# Patient Record
Sex: Female | Born: 1995 | Race: Black or African American | Hispanic: No | Marital: Single | Smoking: Current every day smoker
Health system: Southern US, Community
[De-identification: ages and names within clinical notes are randomized; demographics above are authoritative.]

## PROBLEM LIST (undated history)

## (undated) DIAGNOSIS — J45909 Unspecified asthma, uncomplicated: Secondary | ICD-10-CM

## (undated) DIAGNOSIS — F329 Major depressive disorder, single episode, unspecified: Secondary | ICD-10-CM

## (undated) DIAGNOSIS — G43909 Migraine, unspecified, not intractable, without status migrainosus: Secondary | ICD-10-CM

---
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
## 2018-04-01 IMAGING — CT CT ABD-PELV W/ CM
2 of 4 series · 16 of 46 positions shown, 18 images · IV contrast (APPLIED)
Comparison: Right upper quadrant ultrasound performed earlier today
at [DATE] p.m., and CT of the abdomen and pelvis from 07/10/2014

CLINICAL DATA: Acute onset of generalized abdominal pain and
vaginal discharge. Vomiting. Initial encounter.

EXAM:
CT ABDOMEN AND PELVIS WITH CONTRAST
TECHNIQUE: Multidetector CT imaging of the abdomen and pelvis was performed
using the standard protocol following bolus administration of
intravenous contrast.
CONTRAST:  100mL K5NLVD-V99 IOPAMIDOL (K5NLVD-V99) INJECTION 61%

[Series 2: axial st · axial · 0.80mm/px · z∈[-657,-267]mm · 13 of 86 slices shown, 15 images]
[im 4/86  soft-tissue]
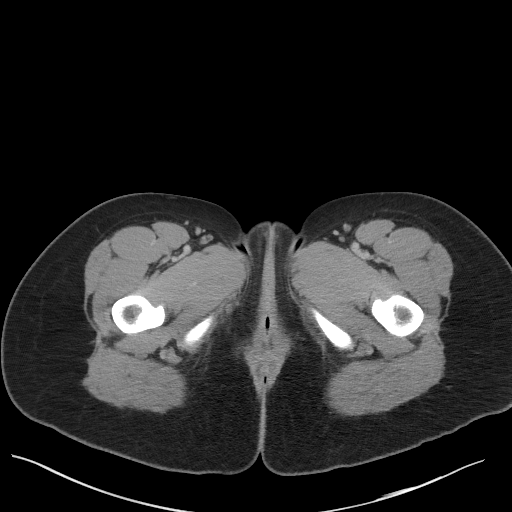
[im 4/86  bone]
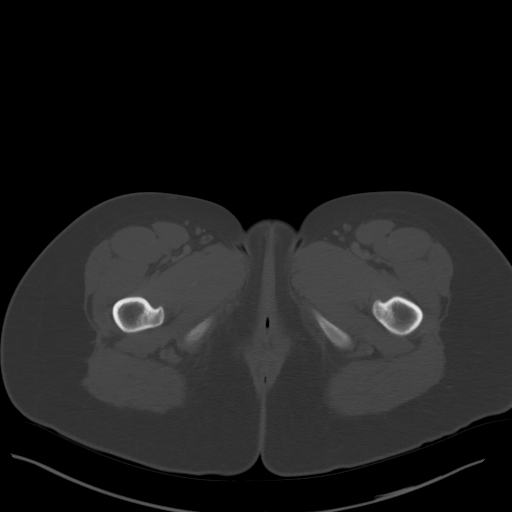
[im 11/86  soft-tissue]
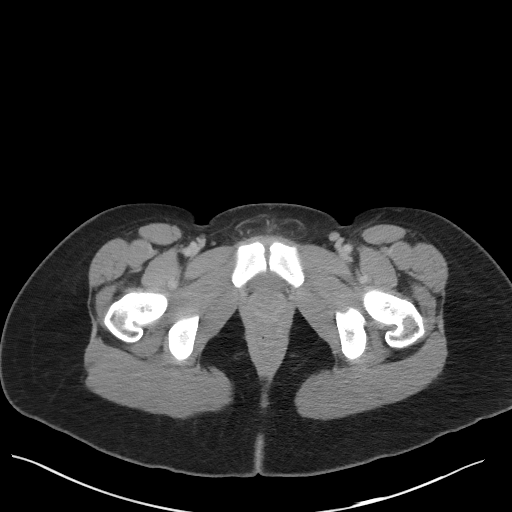
[im 18/86  soft-tissue]
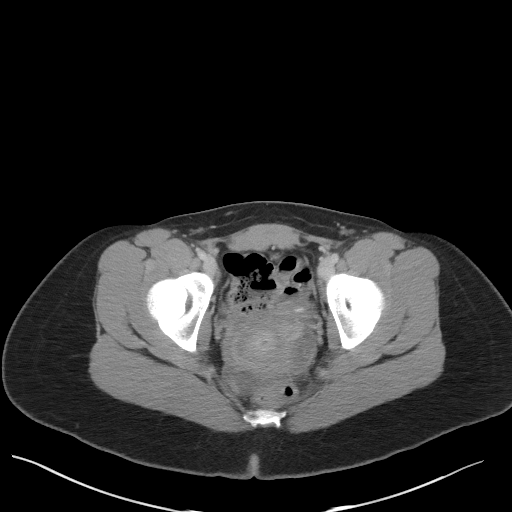
[im 25/86  soft-tissue]
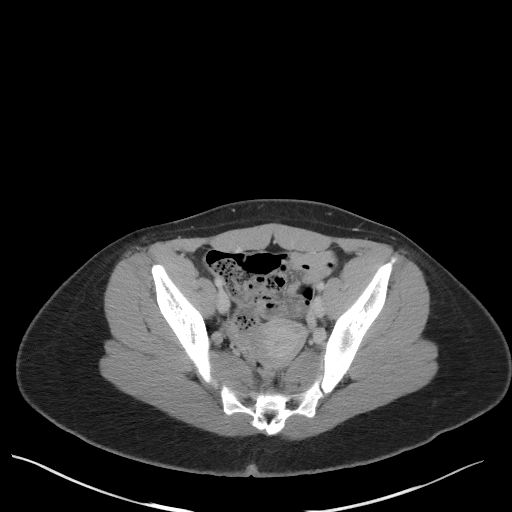
[im 29/86  soft-tissue]
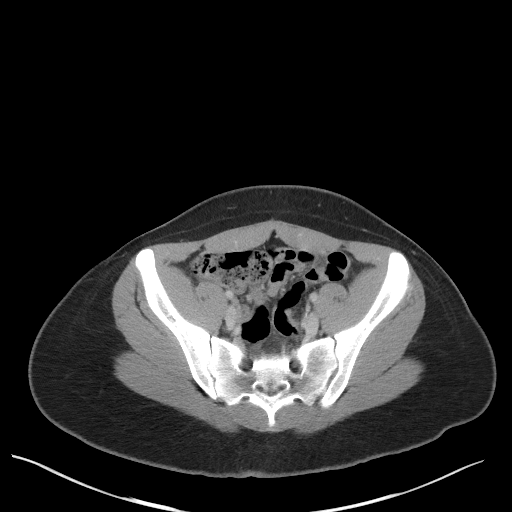
[im 36/86  soft-tissue]
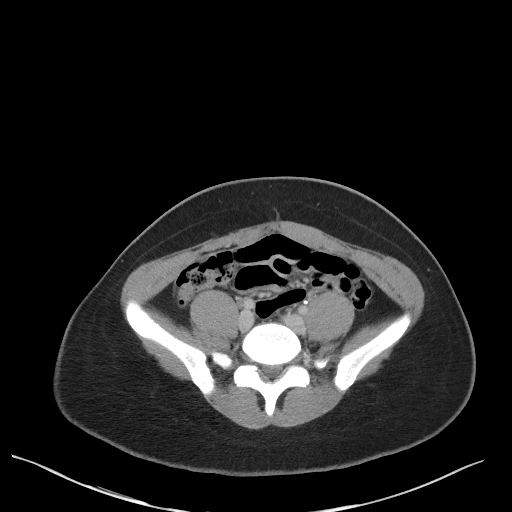
[im 43/86  soft-tissue]
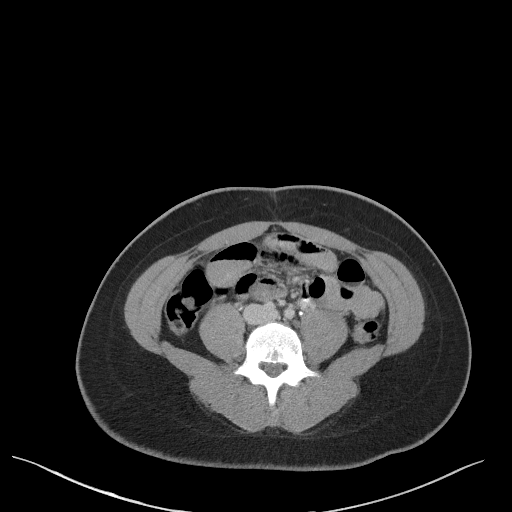
[im 50/86  soft-tissue]
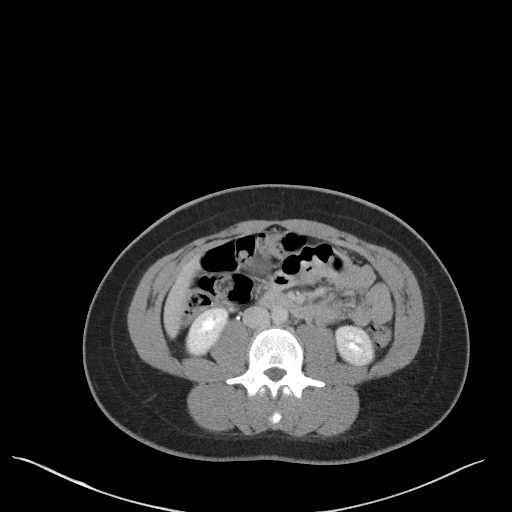
[im 57/86  soft-tissue]
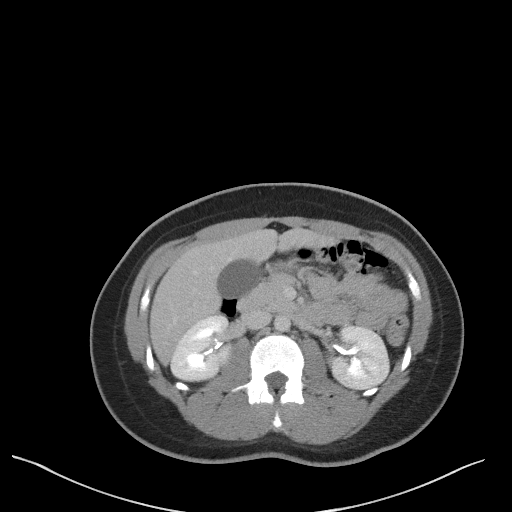
[im 57/86  bone]
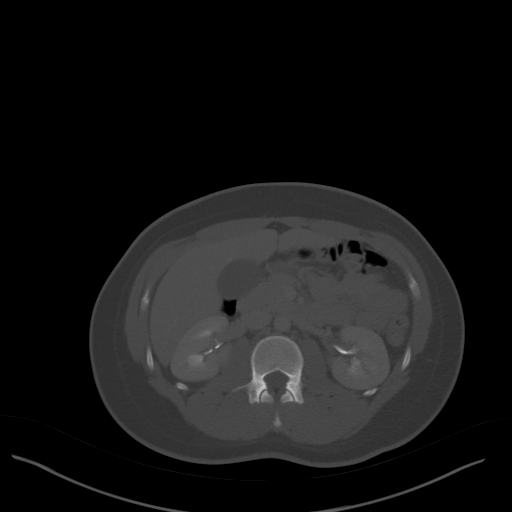
[im 61/86  soft-tissue]
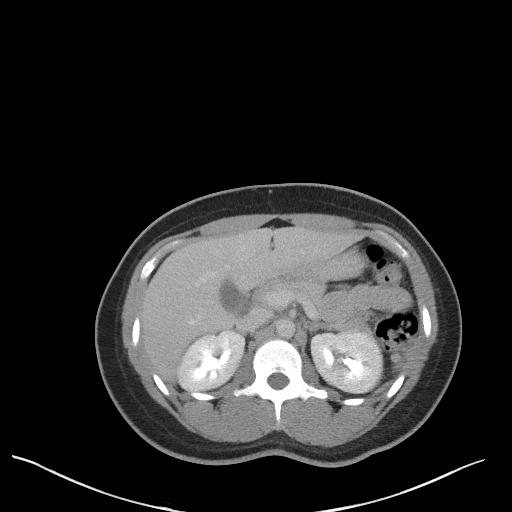
[im 68/86  soft-tissue]
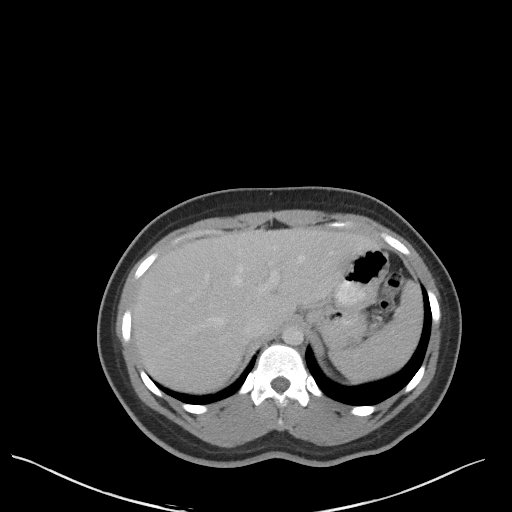
[im 75/86  soft-tissue]
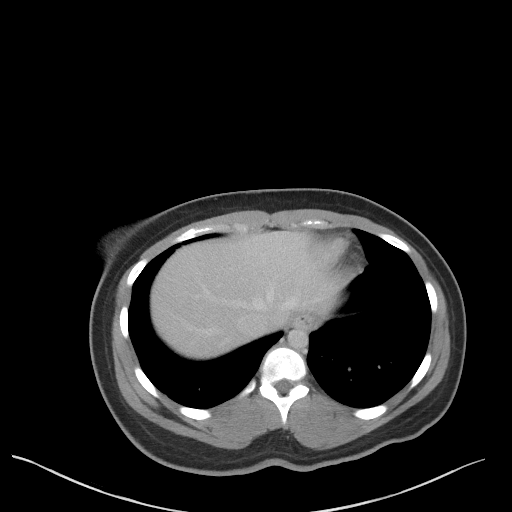
[im 82/86  soft-tissue]
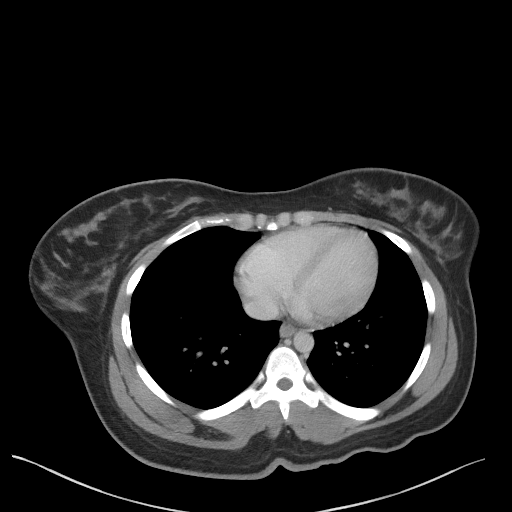

[Series 5: coronal st · coronal · 0.88mm/px · 3 of 69 slices shown]
[im 23/69  soft-tissue]
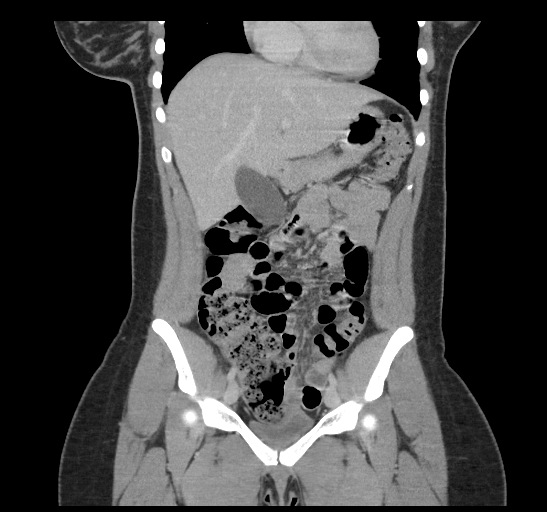
[im 31/69  soft-tissue]
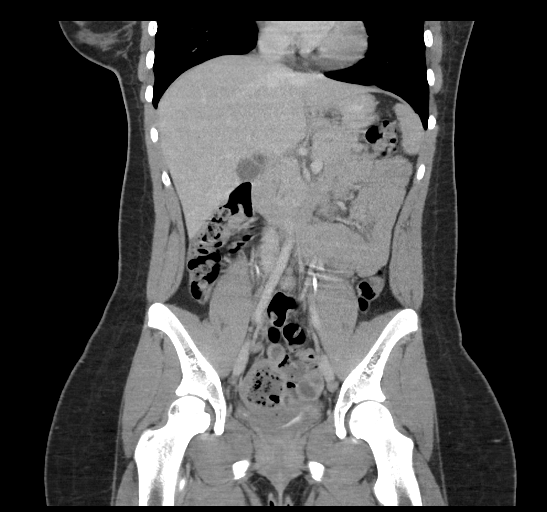
[im 38/69  soft-tissue]
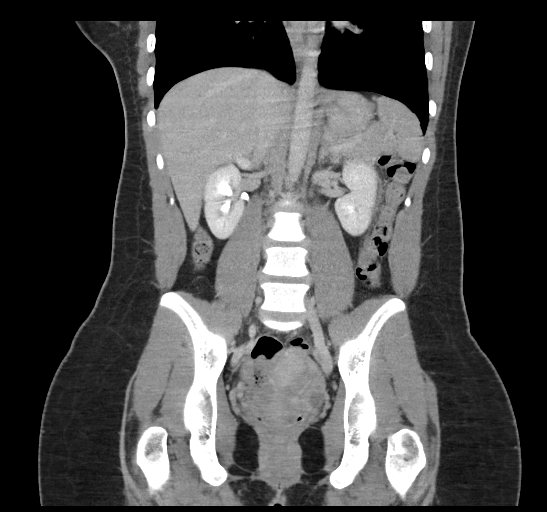

[16 of 46 positions shown; findings below may reference images not displayed]

FINDINGS: Lower chest: The visualized lung bases are grossly clear. The
visualized portions of the mediastinum are unremarkable.

Hepatobiliary: The liver is unremarkable in appearance. The
gallbladder is unremarkable in appearance. The common bile duct
remains normal in caliber.

Pancreas: The pancreas is within normal limits.

Spleen: The spleen is unremarkable in appearance.

Adrenals/Urinary Tract: The adrenal glands are unremarkable in
appearance. The kidneys are within normal limits. There is no
evidence of hydronephrosis. No renal or ureteral stones are
identified, though evaluation for stones is limited given contrast
in the renal calyces. No perinephric stranding is seen.

Stomach/Bowel: The appendix is not definitely characterized ; there
is no evidence for appendicitis. The colon is largely decompressed
and grossly unremarkable in appearance.

The small bowel is unremarkable in appearance. The stomach is
decompressed and grossly unremarkable.

Vascular/Lymphatic: The abdominal aorta is unremarkable in
appearance. The inferior vena cava is grossly unremarkable. No
retroperitoneal lymphadenopathy is seen. No pelvic sidewall
lymphadenopathy is identified.

Reproductive: There is mild soft tissue inflammation about the right
ovary, of uncertain significance. The uterus is grossly
unremarkable. The left ovary is grossly unremarkable in appearance.
The bladder is decompressed and not well assessed.

Other: No additional soft tissue abnormalities are seen.

Musculoskeletal: No acute osseous abnormalities are identified. The
visualized musculature is unremarkable in appearance.
IMPRESSION: Mild nonspecific soft tissue inflammation about the right ovary.
Would correlate clinically for any evidence of pelvic inflammatory
disease.

## 2018-11-19 IMAGING — US US ABDOMEN LIMITED
1 series · 14 of 25 positions shown · non-contrast
Comparison: None.

CLINICAL DATA: Right upper quadrant pain for 5 days

EXAM:
ULTRASOUND ABDOMEN LIMITED RIGHT UPPER QUADRANT

[Series 1: us abdomen limited · 0.21mm/px · 14 of 55 slices shown]
[im 1/55]
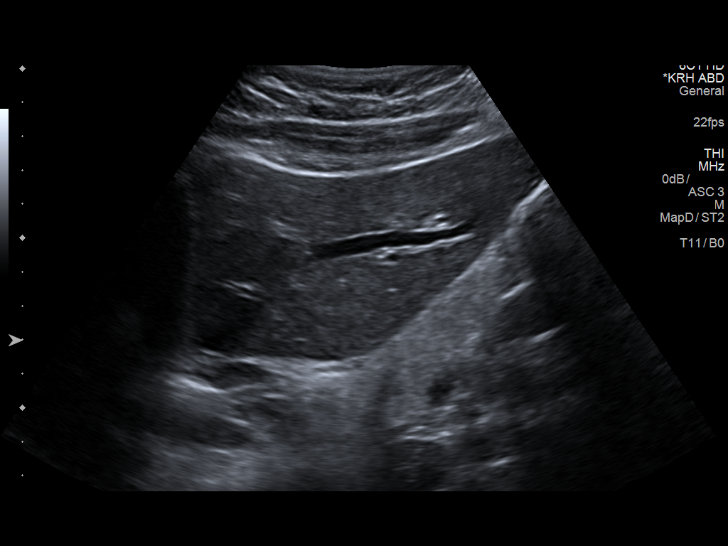
[im 5/55]
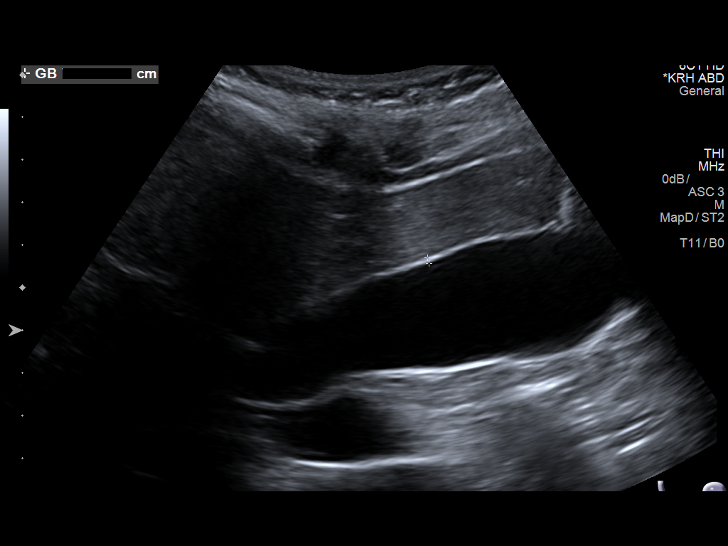
[im 10/55]
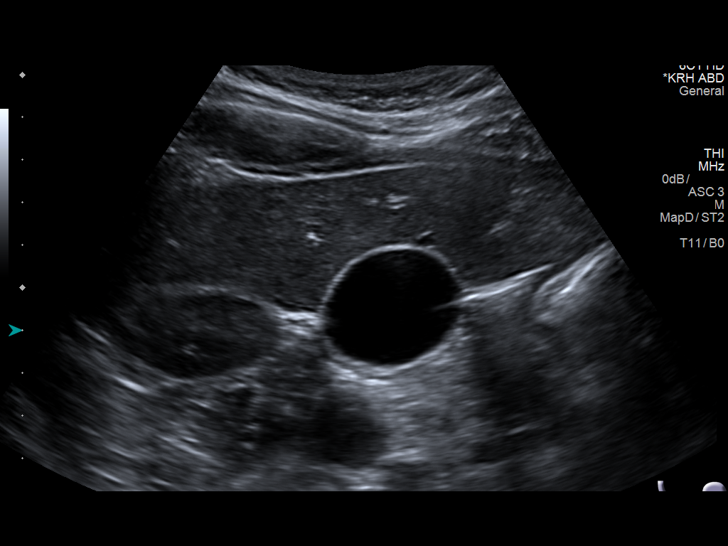
[im 14/55]
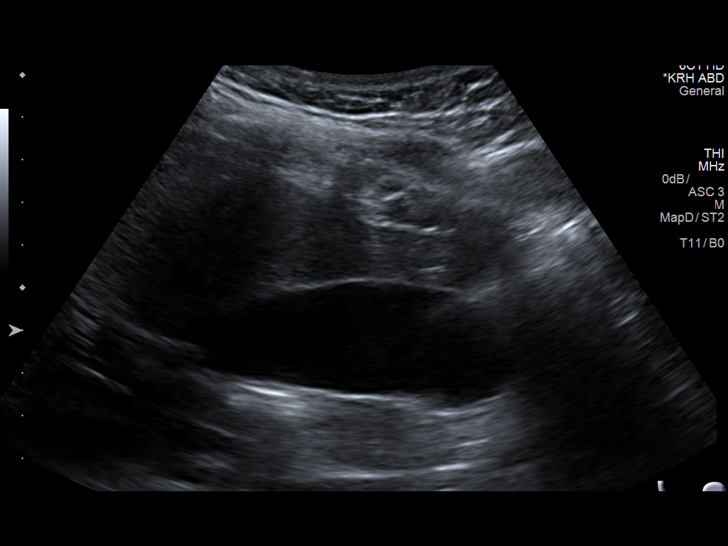
[im 19/55]
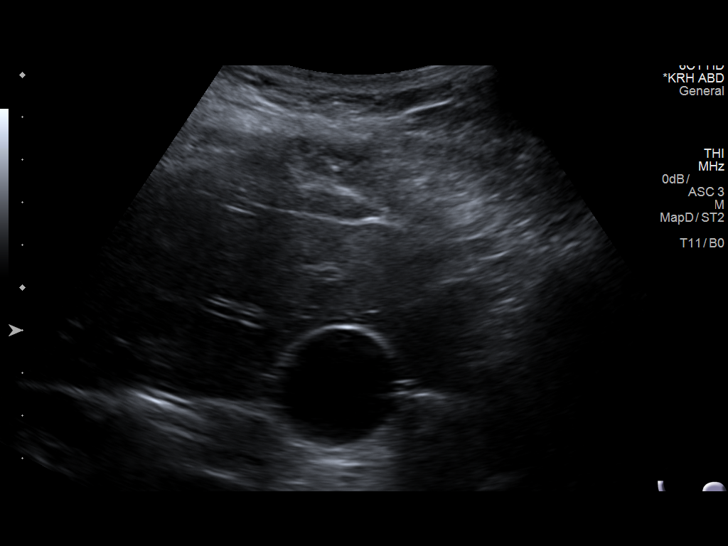
[im 21/55]
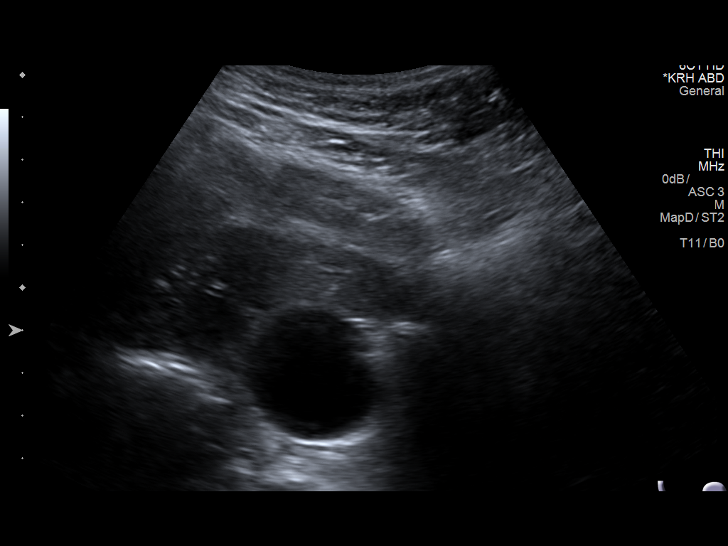
[im 25/55]
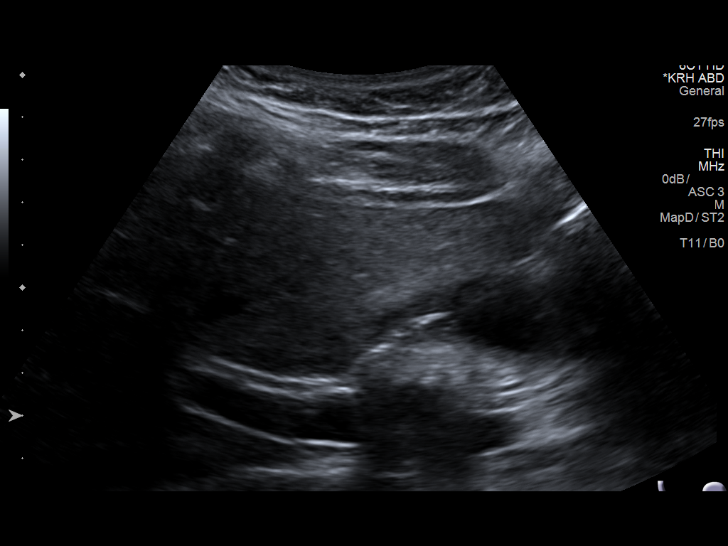
[im 30/55]
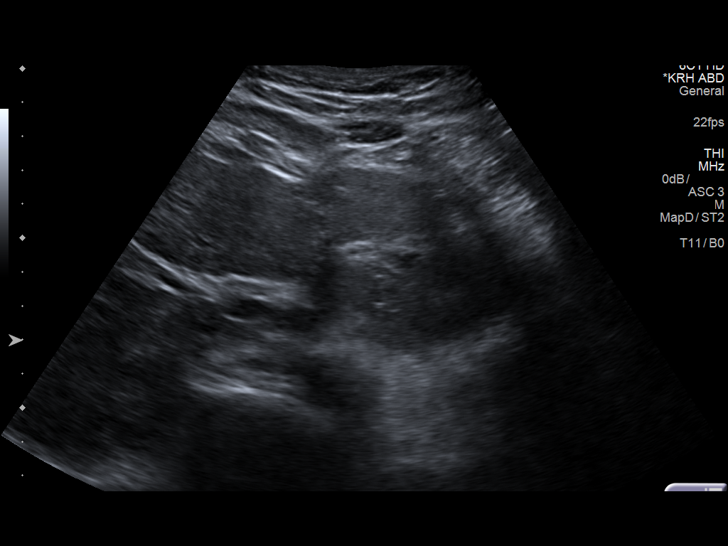
[im 34/55]
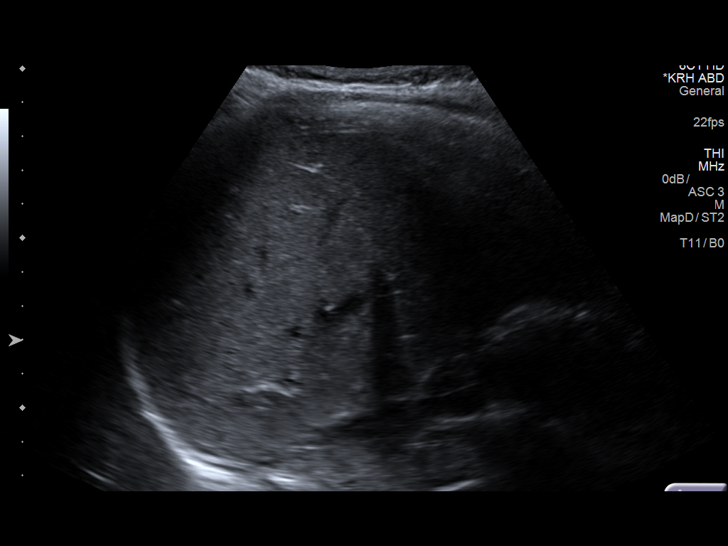
[im 37/55]
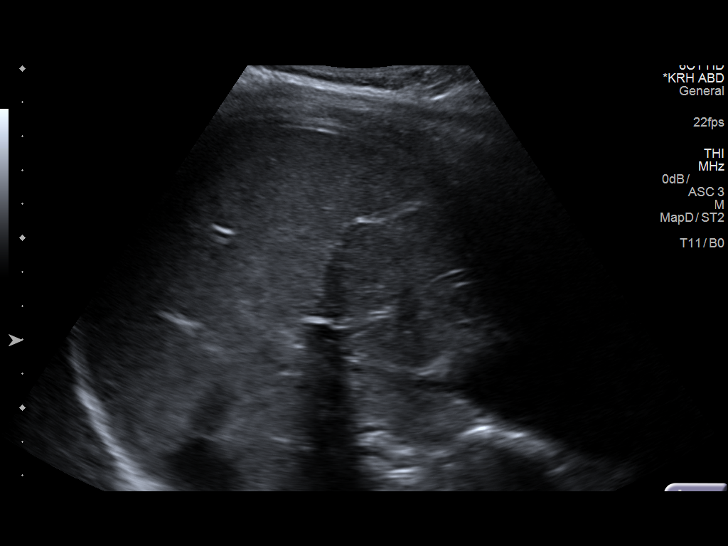
[im 41/55]
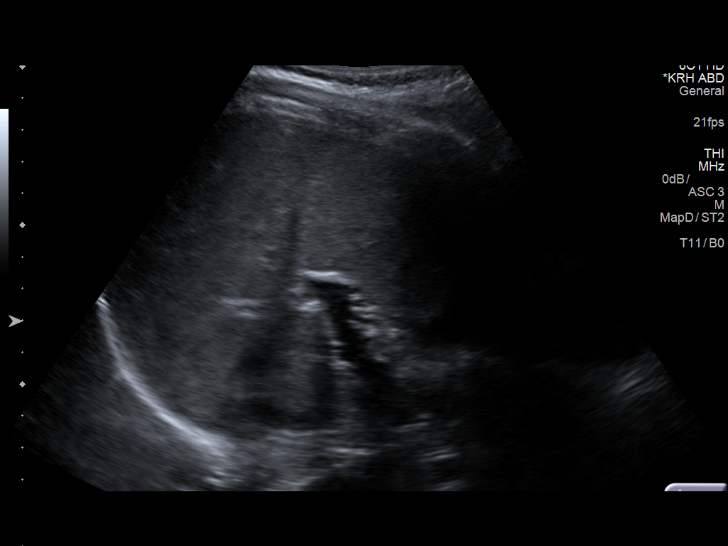
[im 46/55]
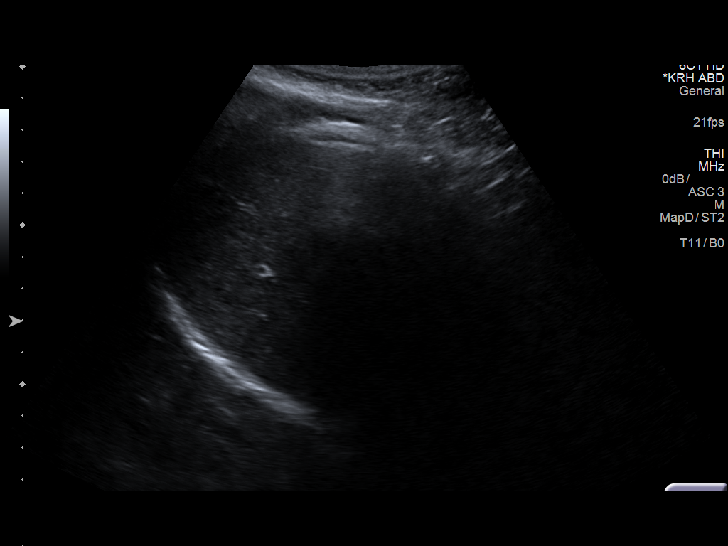
[im 50/55]
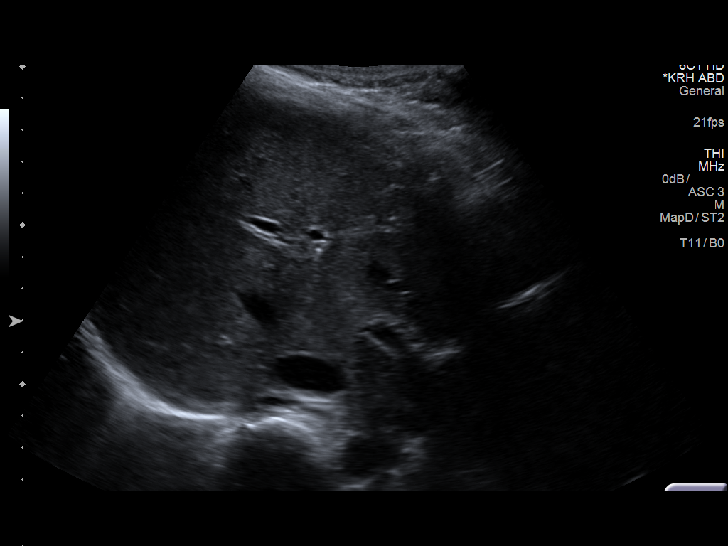
[im 55/55]
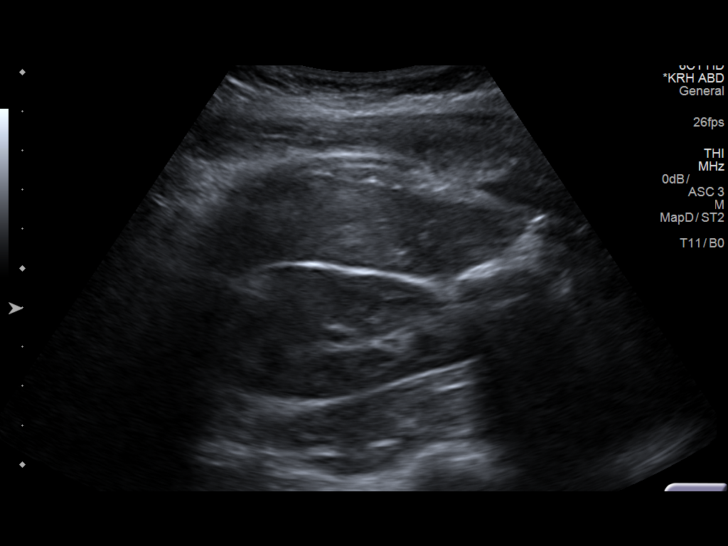

[14 of 25 positions shown; findings below may reference images not displayed]

FINDINGS: Gallbladder:

No gallstones or wall thickening visualized. No sonographic Murphy
sign noted by sonographer.

Common bile duct:

Diameter: 1.5 mm

Liver:

No focal lesion identified. Within normal limits in parenchymal
echogenicity.
IMPRESSION: Normal right upper quadrant ultrasound.

## 2022-02-13 ENCOUNTER — Emergency Department (HOSPITAL_BASED_OUTPATIENT_CLINIC_OR_DEPARTMENT_OTHER)
Admission: EM | Admit: 2022-02-13 | Discharge: 2022-02-14 | Discharge status: Against Medical Advice | Payer: Worker's Compensation | Attending: Emergency Medicine | Admitting: Emergency Medicine

## 2022-02-13 ENCOUNTER — Other Ambulatory Visit: Payer: Self-pay

## 2022-02-13 ENCOUNTER — Encounter (HOSPITAL_BASED_OUTPATIENT_CLINIC_OR_DEPARTMENT_OTHER): Payer: Self-pay | Admitting: Emergency Medicine

## 2022-02-13 DIAGNOSIS — W540XXA Bitten by dog, initial encounter: Secondary | ICD-10-CM | POA: Insufficient documentation

## 2022-02-13 DIAGNOSIS — S71152A Open bite, left thigh, initial encounter: Secondary | ICD-10-CM | POA: Insufficient documentation

## 2022-02-13 DIAGNOSIS — Z23 Encounter for immunization: Secondary | ICD-10-CM | POA: Insufficient documentation

## 2022-02-13 DIAGNOSIS — Z5321 Procedure and treatment not carried out due to patient leaving prior to being seen by health care provider: Secondary | ICD-10-CM | POA: Diagnosis not present

## 2022-02-13 DIAGNOSIS — J45909 Unspecified asthma, uncomplicated: Secondary | ICD-10-CM | POA: Diagnosis not present

## 2022-02-13 DIAGNOSIS — Z7951 Long term (current) use of inhaled steroids: Secondary | ICD-10-CM | POA: Insufficient documentation

## 2022-02-13 DIAGNOSIS — Z91040 Latex allergy status: Secondary | ICD-10-CM | POA: Diagnosis not present

## 2022-02-13 NOTE — ED Triage Notes (Signed)
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  Unknown shot hx of dog.  Tetanus out of date.

## 2022-02-14 ENCOUNTER — Encounter (HOSPITAL_BASED_OUTPATIENT_CLINIC_OR_DEPARTMENT_OTHER): Payer: Self-pay | Admitting: Emergency Medicine

## 2022-02-14 ENCOUNTER — Emergency Department (HOSPITAL_BASED_OUTPATIENT_CLINIC_OR_DEPARTMENT_OTHER)
Admission: EM | Admit: 2022-02-14 | Discharge: 2022-02-14 | Disposition: A | Discharge status: Home / Self Care | Payer: Worker's Compensation | Source: Home / Self Care | Attending: Emergency Medicine | Admitting: Emergency Medicine

## 2022-02-14 DIAGNOSIS — Z91040 Latex allergy status: Secondary | ICD-10-CM | POA: Insufficient documentation

## 2022-02-14 DIAGNOSIS — J45909 Unspecified asthma, uncomplicated: Secondary | ICD-10-CM | POA: Insufficient documentation

## 2022-02-14 DIAGNOSIS — Z7951 Long term (current) use of inhaled steroids: Secondary | ICD-10-CM | POA: Insufficient documentation

## 2022-02-14 DIAGNOSIS — S71152A Open bite, left thigh, initial encounter: Secondary | ICD-10-CM | POA: Insufficient documentation

## 2022-02-14 DIAGNOSIS — Z23 Encounter for immunization: Secondary | ICD-10-CM | POA: Insufficient documentation

## 2022-02-14 DIAGNOSIS — W540XXA Bitten by dog, initial encounter: Secondary | ICD-10-CM | POA: Insufficient documentation

## 2022-02-14 MED ORDER — AMOXICILLIN-POT CLAVULANATE 875-125 MG PO TABS: 1.0000 | ORAL_TABLET | Freq: Two times a day (BID) | ORAL | 0 refills | Status: DC

## 2022-02-14 MED ORDER — TETANUS-DIPHTH-ACELL PERTUSSIS 5-2.5-18.5 LF-MCG/0.5 IM SUSY
0.5000 mL | PREFILLED_SYRINGE | Freq: Once | INTRAMUSCULAR | Status: AC
  Administered 2022-02-14: 0.5 mL via INTRAMUSCULAR
  Filled 2022-02-14: qty 0.5

## 2022-02-14 NOTE — ED Notes (Signed)
Pt does not appear to be in department; not in lobby, not outside.

## 2022-02-14 NOTE — ED Notes (Signed)
Pt discharged to home. Discharge instructions have been discussed with patient and/or family members. Pt verbally acknowledges understanding d/c instructions, and endorses comprehension to checkout at registration before leaving.  °

## 2022-02-14 NOTE — ED Triage Notes (Signed)
Pt arrives pov, steady gait, reports dog bite to LT lateral thigh yesterday evening. Bleeding controlled, unknown if animal UTD on vaccines. Unk. Last tetanus. LWBS after triage yesterday

## 2022-02-14 NOTE — Discharge Instructions (Signed)
Wash the area with soap and water.  You can also put some Vaseline over the area until it is healed.  If it does not look any worse than what it looks right now you do not need to start antibiotics.

## 2022-02-14 NOTE — ED Provider Notes (Signed)
[facility] EMERGENCY DEPARTMENT Provider Note   CSN: [id number] Arrival date & time: 02/14/22  0813     History  Chief Complaint  Patient presents with   Animal Bite    Stephanie Chapman is a 26 y.o. female.  Patient is a 26 year old female with a history of asthma, migraines and depression presenting today after a dog bite.  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  She came to the emergency room last night but the wait was very long and she went home.  She did fill out all the animal control paperwork yesterday.  The dog is a home owned pet and can be followed.  The history is provided by the patient.  Animal Bite      Home Medications Prior to Admission medications   Medication Sig Start Date End Date Taking? Authorizing Provider  amoxicillin-clavulanate (AUGMENTIN) 875-125 MG tablet Take 1 tablet by mouth every 12 (twelve) hours. 02/14/22  Yes Dub Maclellan, Loree Fee, MD  albuterol (PROVENTIL HFA;VENTOLIN HFA) 108 (90 BASE) MCG/ACT inhaler Inhale into the lungs. 09/27/14 09/27/15  [provider]  ALBUTEROL IN Inhale into the lungs.    [provider]  baclofen (LIORESAL) 10 MG tablet TAKE 1 TABLET (10 MG TOTAL) BY MOUTH THREE (3) TIMES A DAY AS NEEDED FOR MUSCLE SPASMS. 08/08/14   [provider]  metroNIDAZOLE (FLAGYL) 500 MG tablet Take 1 tablet (500 mg total) by mouth 2 (two) times daily. 10/10/16   Shary Decamp, PA-C  naproxen (NAPROSYN) 500 MG tablet Take 1 tablet (500 mg total) by mouth 2 (two) times daily. 11/07/92   Delora Fuel, MD  oxyCODONE-acetaminophen (PERCOCET/ROXICET) 5-325 MG tablet Take 2 tablets by mouth every 4 (four) hours as needed for severe pain. 10/10/16   Shary Decamp, PA-C  promethazine (PHENERGAN) 25 MG tablet Take 25 mg by mouth daily as needed. Take 1 tab by mouth every 6 hours prn  09/02/14 09/09/14  [provider]  QUDEXY XR 100 MG CS24 Take 1 tablet at nighttime by mouth 11/16/14   Hickling, Princess Bruins, MD  VENTOLIN HFA 108 (90 BASE) MCG/ACT inhaler INHALE 2 PUFFS EVERY SIX (6) HOURS AS NEEDED FOR WHEEZING OR SHORTNESS OF BREATH. 09/27/14   [provider]      Allergies    Penicillins, Latex, Mango flavor, and Pineapple    Review of Systems   Review of Systems  Physical Exam Updated Vital Signs BP 114/74   Pulse 77   Temp 98.6 F (37 C) (Oral)   Resp 18   Ht 5\' 2"  (1.575 m)   Wt 83.9 kg   LMP 01/19/2022   SpO2 99%   BMI 33.84 kg/m  Physical Exam Vitals and nursing note reviewed.  Cardiovascular:     Rate and Rhythm: Normal rate.     Pulses: Normal pulses.  Pulmonary:     Effort: Pulmonary effort is normal.  Musculoskeletal:       Legs:  Neurological:     Mental Status: She is alert.     ED Results / Procedures / Treatments   Labs (all labs ordered are listed, but only abnormal results are displayed) Labs Reviewed - No data to display  EKG None  Radiology No results found.  Procedures Procedures    Medications Ordered in ED Medications  Tdap (  BOOSTRIX) injection 0.5 mL (has no administration in time range)    ED Course/ Medical Decision Making/ A&P                           Medical Decision Making Risk Prescription drug management.   Patient presenting today after a dog bite last night.  The dog is a pet and the owner is known with animal control paperwork already filled out.  Do not feel that patient needs rabies vaccines today.  Her tetanus shot was updated.  The bite looks fairly superficial without any deep puncture wounds.  Patient given a prescription for antibiotic however reported that she only needed to take it if she started having significant redness or drainage.  Otherwise she will do localized wound care and she is stable for discharge.        Final Clinical Impression(s) / ED  Diagnoses Final diagnoses:  Dog bite, initial encounter    Rx / DC Orders ED Discharge Orders          Ordered    amoxicillin-clavulanate (AUGMENTIN) 875-125 MG tablet  Every 12 hours        02/14/22 0902              Gwyneth Sprout, MD 02/14/22 [phone]

## 2022-02-16 ENCOUNTER — Other Ambulatory Visit: Payer: Self-pay

## 2022-02-16 ENCOUNTER — Encounter (HOSPITAL_BASED_OUTPATIENT_CLINIC_OR_DEPARTMENT_OTHER): Payer: Self-pay | Admitting: Emergency Medicine

## 2022-02-16 ENCOUNTER — Emergency Department (HOSPITAL_BASED_OUTPATIENT_CLINIC_OR_DEPARTMENT_OTHER)
Admission: EM | Admit: 2022-02-16 | Discharge: 2022-02-16 | Disposition: A | Discharge status: Home / Self Care | Payer: 59 | Attending: Emergency Medicine | Admitting: Emergency Medicine

## 2022-02-16 DIAGNOSIS — R52 Pain, unspecified: Secondary | ICD-10-CM | POA: Insufficient documentation

## 2022-02-16 DIAGNOSIS — Z4800 Encounter for change or removal of nonsurgical wound dressing: Secondary | ICD-10-CM | POA: Diagnosis not present

## 2022-02-16 DIAGNOSIS — Z5189 Encounter for other specified aftercare: Secondary | ICD-10-CM

## 2022-02-16 MED ORDER — CYCLOBENZAPRINE HCL 10 MG PO TABS: 10.0000 mg | ORAL_TABLET | Freq: Two times a day (BID) | ORAL | 0 refills | Status: AC | PRN

## 2022-02-16 MED ORDER — CLINDAMYCIN HCL 150 MG PO CAPS: 300.0000 mg | ORAL_CAPSULE | Freq: Three times a day (TID) | ORAL | 0 refills | Status: AC

## 2022-02-16 NOTE — ED Triage Notes (Signed)
Pt arrives pov, steady gait, reports dog bite 10/26. C/o increased pain and redness to LT lateral thigh. Pt denies fever

## 2022-02-18 NOTE — ED Provider Notes (Signed)
[facility] Provider Note   CSN: [id number] Arrival date & time: 02/16/22  4481     History  Chief Complaint  Patient presents with   Leg Injury    Stephanie Chapman is a 26 y.o. female.  HPI      26yo female with history of asthma, MDD, migraine presents with concern for pain surrounding her recent dog bite for which she was seen 2 days ago and rx augmentin.    Reports increased pain and redness surrounding the area, difficulty ambulating due to pain. Had some yellow stuff on bandage. No fevers.    Past Medical History:  Diagnosis Date   Asthma    Major depressive disorder    Migraine     Home Medications Prior to Admission medications   Medication Sig Start Date End Date Taking? Authorizing Provider  clindamycin (CLEOCIN) 150 MG capsule Take 2 capsules (300 mg total) by mouth 3 (three) times daily for 7 days. 02/16/22 02/23/22 Yes Gareth Morgan, MD  cyclobenzaprine (FLEXERIL) 10 MG tablet Take 1 tablet (10 mg total) by mouth 2 (two) times daily as needed for muscle spasms. 02/16/22  Yes Gareth Morgan, MD  albuterol (PROVENTIL HFA;VENTOLIN HFA) 108 (90 BASE) MCG/ACT inhaler Inhale into the lungs. 09/27/14 09/27/15  [provider]  ALBUTEROL IN Inhale into the lungs.    [provider]  baclofen (LIORESAL) 10 MG tablet TAKE 1 TABLET (10 MG TOTAL) BY MOUTH THREE (3) TIMES A DAY AS NEEDED FOR MUSCLE SPASMS. 08/08/14   [provider]  metroNIDAZOLE (FLAGYL) 500 MG tablet Take 1 tablet (500 mg total) by mouth 2 (two) times daily. 10/10/16   Shary Decamp, PA-C  naproxen (NAPROSYN) 500 MG tablet Take 1 tablet (500 mg total) by mouth 2 (two) times daily. 8/56/31   Delora Fuel, MD  oxyCODONE-acetaminophen (PERCOCET/ROXICET) 5-325 MG tablet Take 2 tablets by mouth every 4 (four) hours as needed for severe pain. 10/10/16   Shary Decamp, PA-C  promethazine (PHENERGAN) 25 MG tablet Take 25 mg by mouth daily as needed. Take 1 tab by  mouth every 6 hours prn 09/02/14 09/09/14  [provider]  QUDEXY XR 100 MG CS24 Take 1 tablet at nighttime by mouth 11/16/14   Hickling, Princess Bruins, MD  VENTOLIN HFA 108 (90 BASE) MCG/ACT inhaler INHALE 2 PUFFS EVERY SIX (6) HOURS AS NEEDED FOR WHEEZING OR SHORTNESS OF BREATH. 09/27/14   [provider]      Allergies    Penicillins, Latex, Mango flavor, and Pineapple    Review of Systems   Review of Systems  Physical Exam Updated Vital Signs BP 122/72   Pulse 70   Temp 98.4 F (36.9 C) (Oral)   Resp 18   Ht 5\' 2"  (1.575 m)   Wt 84 kg   LMP 01/19/2022   SpO2 100%   BMI 33.87 kg/m  Physical Exam Vitals and nursing note reviewed.  Constitutional:      General: She is not in acute distress.    Appearance: Normal appearance. She is not ill-appearing, toxic-appearing or diaphoretic.  HENT:     Head: Normocephalic.  Eyes:     Conjunctiva/sclera: Conjunctivae normal.  Cardiovascular:     Rate and Rhythm: Normal rate and regular rhythm.     Pulses: Normal pulses.  Pulmonary:     Effort: Pulmonary effort is normal. No respiratory distress.  Musculoskeletal:        General: Tenderness (surrounding small wound, no crepitus, no fluctuance) present.  No deformity or signs of injury.     Cervical back: No rigidity.  Skin:    General: Skin is warm and dry.     Coloration: Skin is not jaundiced or pale.     Findings: Erythema: see photo, 1cm wound very mild surrounding erythema, no fluctuance, no crepitus.  Neurological:     General: No focal deficit present.     Mental Status: She is alert and oriented to person, place, and time.        ED Results / Procedures / Treatments   Labs (all labs ordered are listed, but only abnormal results are displayed) Labs Reviewed - No data to display  EKG None  Radiology No results found.  Procedures Procedures    Medications Ordered in ED Medications - No data to display  ED Course/ Medical Decision Making/ A&P                            Medical Decision Making Risk Prescription drug management.   26yo female with history of asthma, MDD, migraine presents with concern for pain surrounding her recent dog bite for which she was seen 2 days ago and rx augmentin.   Wound superficial small, mild surrounding erythema appears to be normal healing process and discussed I feel it is appropriate for her to continue her augmentin however if she would like to change antibiotics in setting of worsening pain also feel that is reasonable.  Given rx for clindamycin. Do not see signs of fluctuance/crepitus or suspect abscess or necrotizing infection. Pulses in leg normal, doubt arterial thrombus and no asymmetric swelling to suggest DVT.  Given rx for clindamycin and flexeril. Patient discharged in stable condition with understanding of reasons to return.         Final Clinical Impression(s) / ED Diagnoses Final diagnoses:  Visit for wound check    Rx / DC Orders ED Discharge Orders          Ordered    clindamycin (CLEOCIN) 150 MG capsule  3 times daily        02/16/22 0851    cyclobenzaprine (FLEXERIL) 10 MG tablet  2 times daily PRN        02/16/22 8889              Alvira Monday, MD 02/18/22 1024

## 2023-09-07 ENCOUNTER — Other Ambulatory Visit: Payer: Self-pay

## 2023-09-07 ENCOUNTER — Emergency Department (HOSPITAL_BASED_OUTPATIENT_CLINIC_OR_DEPARTMENT_OTHER)

## 2023-09-07 ENCOUNTER — Emergency Department (HOSPITAL_BASED_OUTPATIENT_CLINIC_OR_DEPARTMENT_OTHER)
Admission: EM | Admit: 2023-09-07 | Discharge: 2023-09-07 | Discharge status: Against Medical Advice | Attending: Emergency Medicine | Admitting: Emergency Medicine

## 2023-09-07 ENCOUNTER — Encounter (HOSPITAL_BASED_OUTPATIENT_CLINIC_OR_DEPARTMENT_OTHER): Payer: Self-pay

## 2023-09-07 DIAGNOSIS — R1030 Lower abdominal pain, unspecified: Secondary | ICD-10-CM | POA: Diagnosis present

## 2023-09-07 DIAGNOSIS — R102 Pelvic and perineal pain: Secondary | ICD-10-CM

## 2023-09-07 DIAGNOSIS — Z91040 Latex allergy status: Secondary | ICD-10-CM | POA: Insufficient documentation

## 2023-09-07 LAB — CBC WITH DIFFERENTIAL/PLATELET
Abs Immature Granulocytes: 0.03 10*3/uL (ref 0.00–0.07)
Basophils Absolute: 0 10*3/uL (ref 0.0–0.1)
Basophils Relative: 0 %
Eosinophils Absolute: 0.1 10*3/uL (ref 0.0–0.5)
Eosinophils Relative: 2 %
HCT: 37.3 % (ref 36.0–46.0)
Hemoglobin: 12.3 g/dL (ref 12.0–15.0)
Immature Granulocytes: 0 %
Lymphocytes Relative: 33 %
Lymphs Abs: 2.2 10*3/uL (ref 0.7–4.0)
MCH: 28.9 pg (ref 26.0–34.0)
MCHC: 33 g/dL (ref 30.0–36.0)
MCV: 87.8 fL (ref 80.0–100.0)
Monocytes Absolute: 0.6 10*3/uL (ref 0.1–1.0)
Monocytes Relative: 9 %
Neutro Abs: 3.8 10*3/uL (ref 1.7–7.7)
Neutrophils Relative %: 56 %
Platelets: 225 10*3/uL (ref 150–400)
RBC: 4.25 MIL/uL (ref 3.87–5.11)
RDW: 13.3 % (ref 11.5–15.5)
WBC: 6.7 10*3/uL (ref 4.0–10.5)
nRBC: 0 % (ref 0.0–0.2)

## 2023-09-07 LAB — BASIC METABOLIC PANEL WITH GFR
Anion gap: 13 (ref 5–15)
BUN: 11 mg/dL (ref 6–20)
CO2: 20 mmol/L — ABNORMAL LOW (ref 22–32)
Calcium: 9.1 mg/dL (ref 8.9–10.3)
Chloride: 104 mmol/L (ref 98–111)
Creatinine, Ser: 0.74 mg/dL (ref 0.44–1.00)
GFR, Estimated: >60 mL/min (ref >60)
Glucose, Bld: 87 mg/dL (ref 70–99)
Potassium: 4 mmol/L (ref 3.5–5.1)
Sodium: 137 mmol/L (ref 135–145)

## 2023-09-07 LAB — URINALYSIS, ROUTINE W REFLEX MICROSCOPIC
Bilirubin Urine: NEGATIVE
Glucose, UA: NEGATIVE mg/dL
Hgb urine dipstick: NEGATIVE
Ketones, ur: NEGATIVE mg/dL
Leukocytes,Ua: NEGATIVE
Nitrite: NEGATIVE
Protein, ur: NEGATIVE mg/dL
Specific Gravity, Urine: >=1.03 (ref 1.005–1.030)
pH: 6.5 (ref 5.0–8.0)

## 2023-09-07 LAB — ABO/RH: ABO/RH(D): A POS

## 2023-09-07 LAB — HCG, QUANTITATIVE, PREGNANCY: hCG, Beta Chain, Quant, S: 7095 m[IU]/mL — ABNORMAL HIGH (ref <5)

## 2023-09-07 NOTE — ED Notes (Signed)
 Pt c/o lower abdominal pain and lower back pain that started 3 days ago.  Claims she is [redacted] weeks pregnant.  Denies vaginal bleeding or discharge

## 2023-09-07 NOTE — ED Provider Notes (Signed)
 [facility] Provider Note   CSN: [id number] Arrival date & time: 09/07/23  1647     History  Chief Complaint  Patient presents with   Abdominal Pain    Stephanie Chapman is a 28 y.o. female.  28 year old female presenting with lower abdominal cramping.  Patient states symptoms began Friday, but have progressed with slight radiation to her right side.  She says it "feels like Braxton Hicks contractions".  Patient recently found out she was pregnant, estimates that she is about 5 weeks, she has not had her first OB appointment yet.  She denies vaginal bleeding/spotting, dysuria/hematuria, change in bowel habits, lightheadedness, fever.  She has what sounds like history of migraine with status migrainosus, including history of facial droop with migraine, she reports that she has had "seizure activity" in the past however her mom clarifies that this was related to the migraine, she mentions being on an antiepileptic medication but is unable to relay what medication this is, upon chart review I am unable to find what medication she may be taking or if this is a teratogenic medication.   Abdominal Pain      Home Medications Prior to Admission medications   Medication Sig Start Date End Date Taking? Authorizing Provider  albuterol (PROVENTIL HFA;VENTOLIN HFA) 108 (90 BASE) MCG/ACT inhaler Inhale into the lungs. 09/27/14 09/27/15  [provider]  ALBUTEROL IN Inhale into the lungs.    [provider]  baclofen (LIORESAL) 10 MG tablet TAKE 1 TABLET (10 MG TOTAL) BY MOUTH THREE (3) TIMES A DAY AS NEEDED FOR MUSCLE SPASMS. 08/08/14   [provider]  cyclobenzaprine  (FLEXERIL ) 10 MG tablet Take 1 tablet (10 mg total) by mouth 2 (two) times daily as needed for muscle spasms. 02/16/22   Scarlette Currier, MD  metroNIDAZOLE  (FLAGYL ) 500 MG tablet Take 1 tablet (500 mg total) by mouth 2 (two) times daily. 10/10/16   Tonya Fredrickson, PA-C   naproxen  (NAPROSYN ) 500 MG tablet Take 1 tablet (500 mg total) by mouth 2 (two) times daily. 11/04/14   Alissa April, MD  oxyCODONE -acetaminophen  (PERCOCET/ROXICET) 5-325 MG tablet Take 2 tablets by mouth every 4 (four) hours as needed for severe pain. 10/10/16   Tonya Fredrickson, PA-C  promethazine (PHENERGAN) 25 MG tablet Take 25 mg by mouth daily as needed. Take 1 tab by mouth every 6 hours prn 09/02/14 09/09/14  [provider]  QUDEXY  XR 100 MG CS24 Take 1 tablet at nighttime by mouth 11/16/14   Dara Ear, MD  VENTOLIN HFA 108 (90 BASE) MCG/ACT inhaler INHALE 2 PUFFS EVERY SIX (6) HOURS AS NEEDED FOR WHEEZING OR SHORTNESS OF BREATH. 09/27/14   [provider]      Allergies    Penicillins, Latex, Mango flavoring agent (non-screening), and Pineapple    Review of Systems   Review of Systems  Gastrointestinal:  Positive for abdominal pain.    Physical Exam Updated Vital Signs BP 123/73 (BP Location: Left Arm)   Pulse 76   Temp 98.6 F (37 C) (Oral)   Resp 14   Wt 74.4 kg   SpO2 100%   BMI 30.00 kg/m  Physical Exam Vitals and nursing note reviewed.  Constitutional:      Appearance: She is well-developed.  HENT:     Head: Normocephalic.  Cardiovascular:     Rate and Rhythm: Normal rate and regular rhythm.  Pulmonary:     Effort: Pulmonary effort is normal.  Breath sounds: Normal breath sounds.  Abdominal:     Palpations: Abdomen is soft.     Tenderness: There is abdominal tenderness (mild) in the suprapubic area. There is no guarding or rebound.  Musculoskeletal:     Right lower leg: No edema.     Left lower leg: No edema.  Skin:    General: Skin is warm and dry.  Neurological:     Mental Status: She is alert.     ED Results / Procedures / Treatments   Labs (all labs ordered are listed, but only abnormal results are displayed) Labs Reviewed  BASIC METABOLIC PANEL WITH GFR - Abnormal; Notable for the following components:      Result Value    CO2 20 (*)    All other components within normal limits  HCG, QUANTITATIVE, PREGNANCY - Abnormal; Notable for the following components:   hCG, Beta Chain, Quant, S 7,095 (*)    All other components within normal limits  CBC WITH DIFFERENTIAL/PLATELET  URINALYSIS, ROUTINE W REFLEX MICROSCOPIC  ABO/RH    EKG None  Radiology US  OB LESS THAN 14 WEEKS WITH OB TRANSVAGINAL Result Date: 09/07/2023 CLINICAL DATA:  Initial evaluation for acute pelvic pain, early pregnancy. EXAM: OBSTETRIC <14 WK US  AND TRANSVAGINAL OB US  TECHNIQUE: Both transabdominal and transvaginal ultrasound examinations were performed for complete evaluation of the gestation as well as the maternal uterus, adnexal regions, and pelvic cul-de-sac. Transvaginal technique was performed to assess early pregnancy. COMPARISON:  None Available. FINDINGS: Intrauterine gestational sac: Single Yolk sac:  Present Embryo:  Negative Cardiac Activity: Negative Heart Rate: N/A MSD: 9.2 mm   5 w   5 d Subchorionic hemorrhage:  None visualized. Maternal uterus/adnexae: Right ovary within normal limits. 2.5 cm complex left ovarian cyst, most consistent with a degenerating corpus luteal cyst. No other adnexal mass or free fluid. IMPRESSION: 1. Early intrauterine gestational sac with internal yolk sac, but no fetal pole or cardiac activity yet visualized. Recommend follow-up quantitative B-HCG levels and follow-up US  in 10-14 days to confirm and assess viability. 2. 2.5 cm left ovarian corpus luteal cyst. 3. No other acute maternal uterine or adnexal abnormality. Electronically Signed   By: Virgia Griffins M.D.   On: 09/07/2023 20:43    Procedures Procedures    Medications Ordered in ED Medications - No data to display  ED Course/ Medical Decision Making/ A&P                                 Medical Decision Making This patient presents to the ED for concern of abdominal cramping, this involves an extensive number of treatment options, and  is a complaint that carries with it a high risk of complications and morbidity.  The differential diagnosis includes threatened/missed/inevitable abortion, ectopic pregnancy, uterine cramping, urinary tract infection.   Co morbidities that complicate the patient evaluation  Questionable history of epilepsy versus migraine with status migrainosus   Additional history obtained:  Additional history obtained from chart review   Lab Tests:  I Ordered, and personally interpreted labs.  The pertinent results include: CBC unremarkable, no leukocytosis.  BMP unremarkable.  Quantitative hCG 7095, this is consistent with a 5-week gestation pregnancy.  ABO/Rh A positive.  Urinalysis unremarkable   Imaging Studies ordered:  I ordered imaging studies including transvaginal US   I independently visualized and interpreted imaging which showed Early intrauterine gestational sac with internal yolk sac, but no fetal pole  or cardiac activity yet visualized. Recommend follow-up quantitative B-HCG levels and follow-up US  in 10-14 days to confirm and assess viability. 2.5 cm left ovarian corpus luteal cyst. No other acute maternal uterine or adnexal abnormality.    I agree with the radiologist interpretation   Cardiac Monitoring: / EKG:  The patient was maintained on a cardiac monitor.  I personally viewed and interpreted the cardiac monitored which showed an underlying rhythm of: Normal sinus rhythm   Problem List / ED Course / Critical interventions / Medication management I have reviewed the patients home medicines and have made adjustments as needed   Test / Admission - Considered:  Patient presenting with abdominal cramping, physical exam is reassuring, there is mild suprapubic tenderness to deep palpation however no peritoneal signs.  That patient is early on in her pregnancy I did elect to order a transvaginal ultrasound, see above for interpretation.  Discussed results in depth with patient and  her mother, advised that she will need to follow-up with her OB to confirm viability, patient related that she does have an appointment on June 10.  Other labs are reassuring as above, patient was waiting on results of urinalysis when she eloped from this emergency department, her urinalysis is negative for infectious process.  Prior to patient eloping I did discuss with her to contact her OB, as she mentioned being on an antiepileptic medication of which she does not know the name.  I discussed with her that often times antiepileptic agents can be harmful to a developing pregnancy, she voiced understanding and confirmed that she would call her OB to discuss her medications and whether or not she should continue these at this time.  Patient eloped prior to receiving discharge paperwork.    Amount and/or Complexity of Data Reviewed Labs: ordered. Radiology: ordered.           Final Clinical Impression(s) / ED Diagnoses Final diagnoses:  Suprapubic cramping    Rx / DC Orders ED Discharge Orders     None         Adolm Ahumada 09/07/23 2133    Nicklas Barns, MD 09/11/23 [phone]

## 2023-09-07 NOTE — ED Notes (Signed)
 EDP went to room to dc pt, pt and Mom not in room. Went to lobby and called her name, she is not in lobby either

## 2023-09-07 NOTE — Discharge Instructions (Addendum)
 Follow-up with your OB/GYN as scheduled.  Contact your OB/GYN to discuss the medications you are taking to confirm that these are approved for use in pregnancy.  Return to the emergency department if your symptoms persist or if you develop severe vaginal bleeding or a fever.

## 2023-09-07 NOTE — ED Triage Notes (Signed)
 Pt arrives with c/o lower ABD pain that started 3 days ago. Pt is currently [redacted] weeks pregnant. Pt denies vaginal bleeding or discharge. Pt endorses cramping sensation and lower back pain. Per pt, pain is worse with exertion. Pts first OBGYN appt is June 10th.
